# Patient Record
Sex: Male | Born: 2000
Health system: Southern US, Community
[De-identification: ages and names within clinical notes are randomized; demographics above are authoritative.]

## PROBLEM LIST (undated history)

## (undated) DIAGNOSIS — F902 Attention-deficit hyperactivity disorder, combined type: Principal | ICD-10-CM

## (undated) DIAGNOSIS — G935 Compression of brain: Secondary | ICD-10-CM

## (undated) DIAGNOSIS — F909 Attention-deficit hyperactivity disorder, unspecified type: Secondary | ICD-10-CM

## (undated) DIAGNOSIS — R278 Other lack of coordination: Secondary | ICD-10-CM

## (undated) DIAGNOSIS — L80 Vitiligo: Secondary | ICD-10-CM

## (undated) HISTORY — DX: Attention-deficit hyperactivity disorder, combined type: F90.2

## (undated) HISTORY — DX: Vitiligo: L80

## (undated) HISTORY — DX: Attention-deficit hyperactivity disorder, unspecified type: F90.9

## (undated) HISTORY — PX: CRANIOTOMY: SHX93

## (undated) HISTORY — PX: OTHER SURGICAL HISTORY: SHX169

## (undated) HISTORY — DX: Other lack of coordination: R27.8

## (undated) HISTORY — PX: HYPOSPADIAS CORRECTION: SHX483

---
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
## 2021-02-16 ENCOUNTER — Other Ambulatory Visit: Payer: Self-pay

## 2021-02-16 MED ORDER — DEXMETHYLPHENIDATE HCL ER 30 MG PO CP24: 30.0000 mg | ORAL_CAPSULE | Freq: Every morning | ORAL | 0 refills | Status: DC

## 2021-02-16 NOTE — Telephone Encounter (Signed)
Next appt not scheduled: Visit date not found Note to pharmacy. No further refills without appointment  E-Prescribed Focalin XR 30 directly to  [id number] [place] [place] - [address] [place] [address] Phone: [phone] Fax: [phone]

## 2021-05-15 ENCOUNTER — Other Ambulatory Visit: Payer: Self-pay

## 2021-05-15 MED ORDER — DEXMETHYLPHENIDATE HCL ER 30 MG PO CP24: 30.0000 mg | ORAL_CAPSULE | Freq: Every morning | ORAL | 0 refills | Status: DC

## 2021-05-15 NOTE — Telephone Encounter (Signed)
RX for above e-scribed and sent to pharmacy on record ° °[facility] [place], [place] - [address] °[address] °[place] [address] °Phone: [phone] Fax: [phone] ° ° °

## 2021-05-29 ENCOUNTER — Encounter: Payer: Medicaid Other | Admitting: Pediatrics

## 2021-06-23 ENCOUNTER — Other Ambulatory Visit: Payer: Self-pay

## 2021-06-23 ENCOUNTER — Telehealth (INDEPENDENT_AMBULATORY_CARE_PROVIDER_SITE_OTHER): Payer: Medicaid Other | Admitting: Pediatrics

## 2021-06-23 ENCOUNTER — Encounter: Payer: Self-pay | Admitting: Pediatrics

## 2021-06-23 DIAGNOSIS — F902 Attention-deficit hyperactivity disorder, combined type: Secondary | ICD-10-CM | POA: Diagnosis not present

## 2021-06-23 DIAGNOSIS — Z719 Counseling, unspecified: Secondary | ICD-10-CM

## 2021-06-23 DIAGNOSIS — Z79899 Other long term (current) drug therapy: Secondary | ICD-10-CM | POA: Diagnosis not present

## 2021-06-23 DIAGNOSIS — R278 Other lack of coordination: Secondary | ICD-10-CM | POA: Diagnosis not present

## 2021-06-23 DIAGNOSIS — H9325 Central auditory processing disorder: Secondary | ICD-10-CM

## 2021-06-23 NOTE — Patient Instructions (Signed)
DISCUSSION: Counseled regarding the following coordination of care items:  Continue medication as directed Focalin XR 30 mg every morning No refill today recently submitted  We discussed short acting dexmethylphenidate 10 to 20 mg for as needed usage for evening studying Ryan Mcdaniel will reach out to me if he would like a trial  Advised importance of:  Sleep Maintain good sleep routines avoiding late nights Limited screen time (none on school nights, no more than 2 hours on weekends) Always reduce screen time Regular exercise(outside and active play) Continue excellent physical activities and social engagements Healthy eating (drink water, no sodas/sweet tea) Protein rich avoiding junk food and empty calories  Discussed tutoring needs with DSO.

## 2021-06-23 NOTE — Progress Notes (Signed)
[facility] Ryan Mcdaniel [address], [place]. [address] [place] [address] Dept: [phone] Dept Fax: [phone]  Medication Check by Caregility due to COVID-19  Patient ID:  Ryan Mcdaniel  male DOB: [birth year]   20 y.o.   MRN: [id number]   DATE:06/23/21  PCP: Eliberto Ivory, MD  Interviewed: Malcolm Metro  Location: at home in [place], on fall break Provider location: [facility] [place] [facility]   Virtual Visit via Video Note Connected with Malcolm Metro on 06/23/21 at  2:00 PM EDT by video enabled telemedicine application and verified that I am speaking with the correct person using two identifiers.     I discussed the limitations, risks, security and privacy concerns of performing an evaluation and management service by telephone and the availability of in person appointments. I also discussed with the parent/patient that there may be a patient responsible charge related to this service. The parent/patient expressed understanding and agreed to proceed.  HISTORY OF PRESENT ILLNESS/CURRENT STATUS: [name] is being followed for medication management for ADHD, dysgraphia and learning differences.   Last visit on 12/01/20  Nathanyl currently prescribed Focalin XR 30 mg every morning  Behaviors: doing well  Eating well (eating breakfast, lunch and dinner). Not with meal plan, will make meals. (Chicken, fish, sandwiches).  Elimination: no concerns  Sleeping: no concerns Sleeping through the night.   EDUCATION: School: [employer]  Sophomore LBST x2 (critical thinking), writing and research), Intro to exercise, foundations, Anatomy & Physiology with lab Wants to coach - doing exercise science. Doing well in all, but Anatomy is difficult did fail the first test.  Not dialed into tutoring yet. 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 :No  Family Medical/ Social History: Changes? No   Patient Lives with: three roommates, off campus apartment, has separate rooms and bathroom Good fit, no drama  MENTAL HEALTH: No concerns  ASSESSMENT:  [name] is 77-years of age with a diagnosis of ADHD/dysgraphia with central auditory processing disorder that is well controlled and adequately medicated.  We discussed tutoring needs for difficult classes and he will access DSO for resources. We discussed study time and maintaining good sleep hygiene and decreasing screen time to optimize performance.  We discussed short acting medication to extend length of day and coverage will reach out to me if you would like to trial Focalin short acting 10 to 20 mg as needed. He is without a meal plan this year we discussed the need for protein rich diets as well as making sure he is eating adequately to cover his brain energy needs as well as maintain weight and cover physical activity.  He is very social and engaging in experiences as well as playing soccer frequently. No changes to current morning medication and the ADHD stable with medication management. We will follow back in 6 months.  DIAGNOSES:    ICD-10-CM   1. ADHD (attention deficit hyperactivity disorder), combined type  F90.2     2. Central auditory processing disorder  H93.25     3. Dysgraphia  R27.8     4. Medication management  Z79.899     5. Patient counseled  Z71.9  RECOMMENDATIONS:  Patient Instructions  DISCUSSION: Counseled regarding the following coordination of care items:  Continue medication as directed Focalin XR 30 mg every morning No refill today recently submitted  We discussed  short acting dexmethylphenidate 10 to 20 mg for as needed usage for evening studying Tahjir will reach out to me if he would like a trial  Advised importance of:  Sleep Maintain good sleep routines avoiding late nights Limited screen time (none on school nights, no more than 2 hours on weekends) Always reduce screen time Regular exercise(outside and active play) Continue excellent physical activities and social engagements Healthy eating (drink water, no sodas/sweet tea) Protein rich avoiding junk food and empty calories  Discussed tutoring needs with DSO.     NEXT APPOINTMENT:  Return in about 7 months (around 01/21/2022) for Medication Check. Please call the office for a sooner appointment if problems arise.  Medical Decision-making:  I spent 20 minutes dedicated to the care of this patient on the date of this encounter to include face to face time with the patient and/or parent reviewing medical records and documentation by teachers, performing and discussing the assessment and treatment plan, reviewing and explaining completed speciality labs and obtaining specialty lab samples.  The patient and/or parent was provided an opportunity to ask questions and all were answered. The patient and/or parent agreed with the plan and demonstrated an understanding of the instructions.   The patient and/or parent was advised to call back or seek an in-person evaluation if the symptoms worsen or if the condition fails to improve as anticipated.  I provided 20 minutes of non-face-to-face time during this encounter.   Completed record review for 5 minutes prior to and after the virtual visit.   Disclaimer: This documentation was generated through the use of dictation and/or voice recognition software, and as such, may contain spelling or other transcription errors. Please disregard any inconsequential errors.  Any questions regarding the content of this documentation should be directed to the  individual who electronically signed.

## 2021-08-18 ENCOUNTER — Other Ambulatory Visit: Payer: Self-pay

## 2021-08-19 MED ORDER — DEXMETHYLPHENIDATE HCL ER 30 MG PO CP24: 30.0000 mg | ORAL_CAPSULE | Freq: Every morning | ORAL | 0 refills | Status: DC

## 2021-08-19 NOTE — Telephone Encounter (Signed)
RX for above e-scribed and sent to pharmacy on record  [id number] [place], [place] - [address] [place] [address] Phone: [phone] Fax: [phone]   

## 2021-08-24 ENCOUNTER — Other Ambulatory Visit: Payer: Self-pay

## 2021-08-24 MED ORDER — DEXMETHYLPHENIDATE HCL ER 30 MG PO CP24
30.0000 mg | ORAL_CAPSULE | Freq: Every morning | ORAL | 0 refills | Status: DC
Start: 2021-08-24 — End: 2021-09-21

## 2021-08-24 NOTE — Telephone Encounter (Signed)
Focalin XR 30 mg daily, # 30 with no RF's.RX for above e-scribed and sent to pharmacy on record  [id number] [place] [place] - [address] [place] [address] Phone: [phone] Fax: [phone]

## 2021-09-21 ENCOUNTER — Telehealth: Payer: Self-pay

## 2021-09-21 ENCOUNTER — Other Ambulatory Visit: Payer: Self-pay

## 2021-09-21 MED ORDER — DEXMETHYLPHENIDATE HCL ER 30 MG PO CP24
30.0000 mg | ORAL_CAPSULE | Freq: Every morning | ORAL | 0 refills | Status: DC
Start: 2021-09-21 — End: 2021-11-19

## 2021-09-21 NOTE — Telephone Encounter (Signed)
RX for above e-scribed and sent to pharmacy on record  [id number] [place], [place] - [address] [place] [address] Phone: [phone] Fax: [phone]   

## 2021-09-22 NOTE — Telephone Encounter (Signed)
Outcome N/Aon January 9 Your PA request has been closed. DEXMETHYLPHENIDATE HYDROCHLORIDE ER 30MG  ER CAP #90/90 days // Prior Auth Req-MD call [phone]. MAX QTY OF 90.000 IN 075 DAYS QUANTITY REMAINING 60.000 NEXT FILL DT [phone], LAST FILL DT [phone] [facility]# [id number] [facility] - Plan Limitations Exceeded 76 // F90.2-Attention-deficit hyperactivity disorder, combined type // Request has been sent to [facility] for review - AG, 09/21/2021 02:04 PM

## 2021-11-19 ENCOUNTER — Other Ambulatory Visit: Payer: Self-pay

## 2021-11-19 MED ORDER — DEXMETHYLPHENIDATE HCL ER 30 MG PO CP24: 30.0000 mg | ORAL_CAPSULE | Freq: Every morning | ORAL | 0 refills | Status: DC

## 2021-11-19 NOTE — Telephone Encounter (Signed)
RX for above e-scribed and sent to pharmacy on record  [id number] [place], [place] - [address] [place] [address] Phone: [phone] Fax: [phone]   

## 2021-12-23 ENCOUNTER — Other Ambulatory Visit: Payer: Self-pay | Admitting: Pediatrics

## 2021-12-23 MED ORDER — DEXMETHYLPHENIDATE HCL ER 30 MG PO CP24: 30.0000 mg | ORAL_CAPSULE | Freq: Every morning | ORAL | 0 refills | Status: DC

## 2021-12-23 NOTE — Telephone Encounter (Signed)
RX for above e-scribed and sent to pharmacy on record  [id number] [place], [place] - [address] [place] [address] Phone: [phone] Fax: [phone]   

## 2022-02-16 ENCOUNTER — Ambulatory Visit (INDEPENDENT_AMBULATORY_CARE_PROVIDER_SITE_OTHER): Payer: Medicaid Other | Admitting: Pediatrics

## 2022-02-16 ENCOUNTER — Encounter: Payer: Self-pay | Admitting: Pediatrics

## 2022-02-16 VITALS — Ht 69.5 in | Wt 170.0 lb

## 2022-02-16 DIAGNOSIS — H9325 Central auditory processing disorder: Secondary | ICD-10-CM

## 2022-02-16 DIAGNOSIS — F902 Attention-deficit hyperactivity disorder, combined type: Secondary | ICD-10-CM | POA: Diagnosis not present

## 2022-02-16 DIAGNOSIS — Z719 Counseling, unspecified: Secondary | ICD-10-CM

## 2022-02-16 DIAGNOSIS — Z79899 Other long term (current) drug therapy: Secondary | ICD-10-CM | POA: Diagnosis not present

## 2022-02-16 MED ORDER — DEXMETHYLPHENIDATE HCL ER 30 MG PO CP24: 30.0000 mg | ORAL_CAPSULE | Freq: Every morning | ORAL | 0 refills | Status: DC

## 2022-02-16 NOTE — Patient Instructions (Signed)
DISCUSSION: Counseled regarding the following coordination of care items:  Continue medication as directed   Focalin XR 30 mg every morning. RX for above e-scribed and sent to pharmacy on record  [phone] [place], [place] - [address] [place] [address] Phone: [phone] Fax: [phone]   Advised importance of:  Sleep Maintain good sleep routines, avoid late nights  Limited screen time (none on school nights, no more than 2 hours on weekends) Maintain good screen time reduction, avoid endless hours of gaming  Regular exercise(outside and active play) Continue daily physical activities with skill building  Healthy eating (drink water, no sodas/sweet tea) Protein rich avoiding junk and empty calories

## 2022-02-16 NOTE — Progress Notes (Signed)
Medication Check  Patient ID: Ryan Mcdaniel  DOB: [birth year]  MRN: [id number]  DATE:02/16/22 Ryan Ivory, MD  Accompanied by: Self Patient Lives with: parents Has student apartment in [place] (has three apartment mates) Not close, just with one - very different from [place] experience  HISTORY/CURRENT STATUS: Chief Complaint - Polite and cooperative and present for medical follow up for medication management of ADHD.  Last in person follow-up 12/01/2020 and last video follow-up 06/23/2021.  Currently prescribed Focalin XR 30 mg every morning, reports daily medication with good compliance. PMP aware reviewed on this date also indicates good compliance.  Requesting no medication changes.   EDUCATION: School: [employer] Year/Grade: rising junior Changed majors from sports management - did not like the classes To criminal justice - wants to be a Charity fundraiser multiple career tests to determine interests. 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  No Sleep concerns. Counseled to maintain good sleep routines avoiding late nights Elimination: no  concerns  Individual Medical History/ Review of Systems: Changes? :No Has yearly check ups, was sick from job in February - they had a kid vomit in the pool, he got hand food mouth disease with blisters Counseled the need for daily physical exams and preventative maintenance  Family Medical/ Social History: Changes?  No concerns  MENTAL HEALTH: Denies sadness, loneliness or depression.  Denies self harm or thoughts of self harm or injury. Denies fears, worries and anxieties. Has good peer relations and is not a bully nor is victimized.     02/16/2022    9:18 AM  Depression screen PHQ 2/9  Decreased Interest 0  Down, Depressed, Hopeless 0  PHQ - 2 Score 0  Altered sleeping 0  Tired, decreased energy 0  Change in appetite 0  Feeling bad or failure about yourself  0  Trouble concentrating 1  Moving slowly or fidgety/restless 1  Suicidal thoughts 0  PHQ-9 Score 2  Difficult doing work/chores Not difficult at all       02/16/2022    9:17 AM  GAD 7 : Generalized Anxiety Score  Nervous, Anxious, on Edge 1  Control/stop worrying 1  Worry too much - different things 0  Trouble relaxing 0  Restless 1  Easily annoyed or irritable 1  Afraid - awful might happen 0  Total GAD 7 Score 4  Anxiety Difficulty Not difficult at all     Adult ADHD Self Report Scale (most recent)     Adult ADHD Self-Report Scale (ASRS-v1.1) Symptom Checklist - 02/16/22 0919       Part A   1. How often do you have trouble wrapping up the final details of a project, once the challenging parts have been done? Never  2. How often do you have  difficulty getting things done in order when you have to do a task that requires organization? Rarely    3. How often do you have problems remembering appointments or obligations? Never  4. When you have a task that requires a lot of thought, how often do you avoid or delay getting started? Rarely    5. How often do you fidget or squirm with your hands or feet when you  have to sit down for a long time? Rarely  6. How often do you feel overly active and compelled to do things, like you were driven by a motor? Rarely      Part B   7. How often do you make careless mistakes when you have to work on a boring or difficult project? Never  8. How often do you have difficulty keeping your attention when you are doing boring or repetitive work? Rarely    9. How often do you have difficulty concentrating on what people say to you, even when they are speaking to you directly? Rarely  10. How often do you misplace or have difficulty finding things at home or at work? Never    11. How often are you distracted by activity or noise around you? Rarely  12. How often do you leave your seat in meetings or other situations in which you are expected to remain seated? Never    13. How often do you feel restless or fidgety? Rarely  14. How often do you have difficulty unwinding and relaxing when you have time to yourself? Never    15. How often do you find yourself talking too much when you are in social situations? Never  16. When you are in a conversation, how often do you find yourself finishing the sentences of the people you are talking to, before they can finish them themselves? Rarely    17. How often do you have difficulty waiting your turn in situations when turn taking is required? Never  18. How often do you interrupt others when they are busy? Never      Comment   How old were you when these problems first began to occur? 0                PHYSICAL EXAM; Vitals:   02/16/22 0904  Weight: 170 lb (77.1 kg)  Height: 5' 9.5" (1.765 m)   Body mass index is 24.74 kg/m.  General Physical Exam: Unchanged from previous exam, date:12/01/20   ASSESSMENT:  Ryan Mcdaniel is 59-years of age with a diagnosis of ADHD with a history of CAPD that is improved and well controlled with current medication.  No medication changes at this time. We discussed the continued need for daily  screen time reduction, daily physical activities and more physicality.  Protein rich food to avoid junk and empty calories.  Continue excellent social skills and skill groups.  Maintain good sleep routines avoiding late nights. ADHD stable with medication management Has Appropriate school accommodations with progress academically I spent 35 minutes face to face on the date of service and engaged in the above activities to include counseling and education.  DIAGNOSES:    ICD-10-CM   1. ADHD (attention deficit hyperactivity disorder), combined type  F90.2     2. Central auditory processing disorder  H93.25     3. Medication management  Z79.899     4. Patient counseled  Z71.9       RECOMMENDATIONS:  Patient Instructions  DISCUSSION: Counseled regarding the following  coordination of care items:  Continue medication as directed   Focalin XR 30 mg every morning. RX for above e-scribed and sent to pharmacy on record  [id number] [place] [place] [facility] [id number] [place] [address] Phone: [phone] Fax: [phone]   Advised importance of:  Sleep Maintain good sleep routines, avoid late nights  Limited screen time (none on school nights, no more than 2 hours on weekends) Maintain good screen time reduction, avoid endless hours of gaming  Regular exercise(outside and active play) Continue daily physical activities with skill building  Healthy eating (drink water, no sodas/sweet tea) Protein rich avoiding junk and empty calories      NEXT APPOINTMENT:  Return in about 6 months (around 08/18/2022) for Medication Check.  Disclaimer: This documentation was generated through the use of dictation and/or voice recognition software, and as such, may contain spelling or other transcription errors. Please disregard any inconsequential errors.  Any questions regarding the content of this documentation should be directed to the individual  who electronically signed.

## 2022-03-02 ENCOUNTER — Other Ambulatory Visit: Payer: Self-pay | Admitting: Pediatrics

## 2022-03-02 MED ORDER — DEXMETHYLPHENIDATE HCL ER 30 MG PO CP24
30.0000 mg | ORAL_CAPSULE | Freq: Every morning | ORAL | 0 refills | Status: DC
Start: 2022-03-02 — End: 2022-03-29

## 2022-03-02 NOTE — Telephone Encounter (Signed)
Spoke with pharmacy.  Issue may be #90.  Will fill for #30 and will need monthly prescriptions for 30 day supplies. Giovanni notified by email.

## 2022-03-02 NOTE — Telephone Encounter (Signed)
RX for above e-scribed and sent to pharmacy on record  [id number] [place], [place] - [address] [place] [address] Phone: [phone] Fax: [phone]   

## 2022-03-02 NOTE — Addendum Note (Signed)
Addended by: Shelby Peltz A on: 03/02/2022 12:12 PM   Modules accepted: Orders

## 2022-03-02 NOTE — Telephone Encounter (Signed)
PDMP aware reconciliation completed needed to open visit to do so

## 2022-03-29 ENCOUNTER — Other Ambulatory Visit: Payer: Self-pay

## 2022-03-29 MED ORDER — DEXMETHYLPHENIDATE HCL ER 30 MG PO CP24: 30.0000 mg | ORAL_CAPSULE | Freq: Every morning | ORAL | 0 refills | Status: DC

## 2022-03-29 NOTE — Telephone Encounter (Signed)
E-Prescribed dexmethylphenidate ER 30 mg directly to  [id number] [place], [place] - [address] [facility] [address] [place] [address] Phone: [phone] Fax: [phone]

## 2022-03-30 ENCOUNTER — Telehealth: Payer: Self-pay

## 2022-03-30 NOTE — Telephone Encounter (Signed)
Outcome  Additional Information Required  Your PA has been resolved, no additional PA is required. For further inquiries please contact the number on the back of the member prescription card. (Message [phone])

## 2022-05-28 ENCOUNTER — Other Ambulatory Visit: Payer: Self-pay

## 2022-05-28 MED ORDER — DEXMETHYLPHENIDATE HCL ER 30 MG PO CP24: 30.0000 mg | ORAL_CAPSULE | Freq: Every morning | ORAL | 0 refills | Status: DC

## 2022-05-28 NOTE — Telephone Encounter (Signed)
Fccalin XR 30 mg daily, # 30 with no RF's.RX for above e-scribed and sent to pharmacy on record  [phone] [place], [place] - [address] [address] [place] [address] Phone: [phone] Fax: [phone]

## 2022-07-01 ENCOUNTER — Other Ambulatory Visit: Payer: Self-pay

## 2022-07-01 MED ORDER — DEXMETHYLPHENIDATE HCL ER 30 MG PO CP24: 30.0000 mg | ORAL_CAPSULE | Freq: Every morning | ORAL | 0 refills | Status: DC

## 2022-07-01 NOTE — Telephone Encounter (Signed)
RX for above e-scribed and sent to pharmacy on record  [id number] [place], [place] - [address] [place] [address] Phone: [phone] Fax: [phone]

## 2022-07-30 ENCOUNTER — Other Ambulatory Visit: Payer: Self-pay

## 2022-07-30 MED ORDER — DEXMETHYLPHENIDATE HCL ER 30 MG PO CP24
30.0000 mg | ORAL_CAPSULE | Freq: Every morning | ORAL | 0 refills | Status: DC
Start: 2022-07-30 — End: 2022-08-31

## 2022-07-30 NOTE — Telephone Encounter (Signed)
RX for above e-scribed and sent to pharmacy on record  [id number] [place], [place] - [address] [place]  [address] Phone: [phone] Fax: [phone]   

## 2022-08-31 ENCOUNTER — Other Ambulatory Visit: Payer: Self-pay

## 2022-08-31 MED ORDER — DEXMETHYLPHENIDATE HCL ER 30 MG PO CP24: 30.0000 mg | ORAL_CAPSULE | Freq: Every morning | ORAL | 0 refills | Status: DC

## 2022-08-31 NOTE — Telephone Encounter (Signed)
RX for above e-scribed and sent to pharmacy on record  [phone] [place], [place] - [address] [facility] [address] [facility] [address] [place] [address] Phone: [phone] Fax: [phone]

## 2022-09-08 ENCOUNTER — Telehealth: Payer: Medicaid Other | Admitting: Pediatrics

## 2022-09-28 ENCOUNTER — Other Ambulatory Visit: Payer: Self-pay

## 2022-09-28 MED ORDER — DEXMETHYLPHENIDATE HCL ER 30 MG PO CP24: 30.0000 mg | ORAL_CAPSULE | Freq: Every morning | ORAL | 0 refills | Status: DC

## 2022-09-28 NOTE — Telephone Encounter (Signed)
Focalin XR 30 mg daily, #30 with no RF's.RX for above e-scribed and sent to pharmacy on record  [id number] [place] [address] [place]: [phone]: [phone]

## 2022-10-08 ENCOUNTER — Telehealth (INDEPENDENT_AMBULATORY_CARE_PROVIDER_SITE_OTHER): Payer: 59 | Admitting: Pediatrics

## 2022-10-08 ENCOUNTER — Encounter: Payer: Self-pay | Admitting: Pediatrics

## 2022-10-08 DIAGNOSIS — F902 Attention-deficit hyperactivity disorder, combined type: Secondary | ICD-10-CM | POA: Diagnosis not present

## 2022-10-08 DIAGNOSIS — Z79899 Other long term (current) drug therapy: Secondary | ICD-10-CM | POA: Diagnosis not present

## 2022-10-08 DIAGNOSIS — Z719 Counseling, unspecified: Secondary | ICD-10-CM

## 2022-10-08 NOTE — Patient Instructions (Signed)
DISCUSSION: Counseled regarding the following coordination of care items:  Transition of care back to PCP-patient is aware  Continue medication as directed Focalin XR 30 mg every morning  RX for above e-scribed and sent to pharmacy on record  [id number] [place], [place] - [address] [place] [address] Phone: [phone] Fax: [phone]  Advised importance of:  Sleep Maintain good sleep routines and avoid late nights Limited screen time (none on school nights, no more than 2 hours on weekends) Continue screen time reduction Regular exercise(outside and active play) Continue daily physical activities with skill building Healthy eating (drink water, no sodas/sweet tea) Routine rich avoiding junk and empty calories   Additional resources for parents:  [place] - [email or web address].org/ ADD[employer] HolyTattoo.de

## 2022-10-08 NOTE — Progress Notes (Signed)
[facility] [address] [place] [address] Dept: [phone] Dept Fax: [phone]  Medication Check by Caregility due to COVID-19  Patient ID:  Ryan Mcdaniel  male DOB: 2000/09/19   22 y.o.   MRN: [id number]   DATE:10/08/22  Interviewed: Coletta Memos  Location: [place] apartment no additional individuals present Provider location: [facility] office  Virtual Visit via Video Note Connected with Coletta Memos on 10/08/22 at  3:00 PM EST by video enabled telemedicine application and verified that I am speaking with the correct person using two identifiers.     I discussed the limitations, risks, security and privacy concerns of performing an evaluation and management service by telephone and the availability of in person appointments. I also discussed with the parent/patient that there may be a patient responsible charge related to this service. The parent/patient expressed understanding and agreed to proceed.  HISTORY OF PRESENT ILLNESS/CURRENT STATUS: Ryan Mcdaniel is being followed for medication management for ADHD, CAPD and learning differences.  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 Wants to be a Tax adviser  Employed - new job - [employer] - get's paid more, less stressful, better pay and likes it better  Activities/ Exercise: daily Soccer Insurance claims handler Counseled maintain daily activities with social engagements  Screen time: (phone, tablet, TV, computer): non-essential, not excessive Counseled screen time reduction and avoiding social media MEDICAL HISTORY: Individual Medical History/ Review of Systems: Changes? :No  Family Medical/ Social History: Changes? Yes counseling provided today regarding parental marital discord discussed coping skills and strategies to minimize emotional effects.  The majority of this visit included counseling regarding parental discord as well as his personal relationship challenges. Patient Lives with: at school, has three roommates (one is a best friend), two random. No concerns for roommates.  ASSESSMENT:  Ryan Mcdaniel is 30-years of age with a diagnosis of ADHD that is improved and well-controlled with current medication.  No medication changes at this time.  We discussed the transition of care back to primary care and notes will be sent to the PCP on record.  I do recommend that Ryan Mcdaniel establish with family practice in his area in [place] for ease of medication management.  Additional resources for medication management were also provided. I do recommend if there are glitches to contact our office phone number and request refills from the pediatric subspecialty group that we are transferring to. Overall Ryan Mcdaniel ADHD stable with medication management  DIAGNOSES:    ICD-10-CM   1. ADHD (attention deficit hyperactivity disorder), combined type  F90.2     2. Medication management  Z79.899     3. Patient counseled  Z71.9        RECOMMENDATIONS:  Patient Instructions  DISCUSSION: Counseled regarding the following coordination of care items:  Transition of care back to PCP-patient is aware  Continue medication as directed Focalin XR 30 mg every morning  RX for above e-scribed and sent to pharmacy on  record  [id number] [place],  - [address] [place] [address] Phone: [phone] Fax: [phone]  Advised importance of:  Sleep Maintain good sleep routines and avoid late nights Limited screen time (none on school nights, no more than 2 hours on weekends) Continue screen time reduction Regular exercise(outside and active play) Continue daily physical activities with skill building Healthy eating (drink water, no sodas/sweet tea) Routine rich avoiding junk and empty calories   Additional resources for parents:  [place] - https://childmind.org/ ADDitude Magazine HolyTattoo.de        NEXT APPOINTMENT:  Return for Transition of care back to PCP. Please call the office for a sooner appointment if problems arise.  Medical Decision-making:  I spent 40 minutes dedicated to the care of this patient on the date of this encounter to include face to face time with the patient and/or parent reviewing medical records and documentation by teachers, performing and discussing the assessment and treatment plan, reviewing and explaining completed speciality labs and obtaining specialty lab samples.  The patient and/or parent was provided an opportunity to ask questions and all were answered. The patient and/or parent agreed with the plan and demonstrated an understanding of the instructions.   The patient and/or parent was advised to call back or seek an in-person evaluation if the symptoms worsen or if the condition fails to improve as anticipated.  I provided 40 minutes of video-face-to-face time during this encounter.   Completed record review for 15 minutes prior to and after the virtual visit.   Disclaimer: This documentation was generated through the use of dictation and/or voice recognition software, and as such, may contain spelling or other transcription errors. Please disregard any inconsequential errors.  Any questions regarding the content of this documentation should be  directed to the individual who electronically signed.

## 2022-10-15 ENCOUNTER — Telehealth: Payer: Medicaid Other | Admitting: Pediatrics

## 2022-10-29 ENCOUNTER — Telehealth: Payer: Self-pay | Admitting: Family

## 2022-10-29 DIAGNOSIS — F902 Attention-deficit hyperactivity disorder, combined type: Secondary | ICD-10-CM

## 2022-10-29 MED ORDER — DEXMETHYLPHENIDATE HCL ER 30 MG PO CP24: 30.0000 mg | ORAL_CAPSULE | Freq: Every morning | ORAL | 0 refills | Status: DC

## 2022-10-29 NOTE — Telephone Encounter (Signed)
Focalin XR 30 mg daily, #30 with no RF's.RX for above e-scribed and sent to pharmacy on record  [id number] [place] [address] [place]: [phone]: [phone]

## 2022-10-29 NOTE — Telephone Encounter (Signed)
Last OV 10/08/2022 by Janifer Adie NP Advised at that visit to establish with [facility] in [place] where he lives.  Focalin 30 mg XR last filled 10/08/22 with no refills

## 2022-10-29 NOTE — Telephone Encounter (Signed)
  Name of who is calling: Xz[place]on (self)  Best contact number: [phone]  Provider they see: [place]  Reason for call: Patient is calling needing a refill on his medication (Focalin) 30 mg.      PRESCRIPTION REFILL ONLY  Name of prescription: Focalin (Dexmethylphenidate)   [facility] [address]

## 2022-12-03 ENCOUNTER — Other Ambulatory Visit: Payer: Self-pay | Admitting: Pediatrics

## 2022-12-03 DIAGNOSIS — F902 Attention-deficit hyperactivity disorder, combined type: Secondary | ICD-10-CM

## 2022-12-03 NOTE — Telephone Encounter (Signed)
  Name of who is calling: Ryan Mcdaniel Relationship to Patient:  Best contact number: [phone]  Provider they see: Hoy Register  Reason for call: He is calling to get a refill on his medication. Previously he saw Janifer Adie and she told him to call us and speak with Dr. Rogers Blocker to get his meds refilled. Please follow up      [place]  Name of prescription:  Pharmacy:

## 2022-12-03 NOTE — Telephone Encounter (Signed)
Contacted patient.  Verified his DOB.  Informed him that he would need to contact his PCP for medication refills.   Ryan Mcdaniel stated that he does not have a PCP nor would they be able to help him because this is a psychiatry medication.   (Dexmethylphenidate HCl 30 MG)   According to patient, Ryan Mcdaniel told him to ask Dr. Rogers Blocker for refills and to manage his medications.   I informed patient that I would send this request to Dr. Rogers Blocker but there is a chance that she mat deny this request because he has not established care with her. In the meantime, I advised him to establish care with a PCP ASAP to help with medication management.   SS, CCMA

## 2022-12-06 MED ORDER — DEXMETHYLPHENIDATE HCL ER 30 MG PO CP24: 30.0000 mg | ORAL_CAPSULE | Freq: Every morning | ORAL | 0 refills | Status: AC

## 2022-12-06 NOTE — Telephone Encounter (Signed)
Patient was advised in January and February that he needs to establish PCP.    Because he was seen within the last 3 months, I will send 1 month supply.  However he MUST establish with PCP or another provider for further refills.   Carylon Perches MD MPH

## 2023-04-08 ENCOUNTER — Institutional Professional Consult (permissible substitution): Payer: Medicaid Other | Admitting: Pediatrics

## 2024-04-16 ENCOUNTER — Ambulatory Visit
Admission: EM | Admit: 2024-04-16 | Discharge: 2024-04-16 | Disposition: A | Discharge status: Home / Self Care | Attending: Family Medicine | Admitting: Family Medicine

## 2024-04-16 ENCOUNTER — Ambulatory Visit

## 2024-04-16 ENCOUNTER — Encounter: Payer: Self-pay | Admitting: Emergency Medicine

## 2024-04-16 DIAGNOSIS — M25571 Pain in right ankle and joints of right foot: Secondary | ICD-10-CM

## 2024-04-16 DIAGNOSIS — S99911A Unspecified injury of right ankle, initial encounter: Secondary | ICD-10-CM | POA: Diagnosis not present

## 2024-04-16 NOTE — ED Provider Notes (Signed)
 [name]    CSN: [id number] Arrival date & time: 04/16/24  1853      History   Chief Complaint Chief Complaint  Patient presents with   Ankle Pain    HPI Luisantonio Adinolfi is a 23 y.o. male.   HPI 23 year old male presents with right ankle pain secondary to right ankle injury injury running today.SABRA  PMH significant for ADHD, Chiari I malformation, and dysgraphia  Past Medical History:  Diagnosis Date   ADHD (attention deficit hyperactivity disorder)    ADHD (attention deficit hyperactivity disorder), combined type 12/30/2015   Chiari I malformation (HCC)    Dysgraphia 12/30/2015   Primary vitiligo 12/30/2015   Vitiligo     Patient Active Problem List   Diagnosis Date Noted   Central auditory processing disorder 04/11/2020   ADHD (attention deficit hyperactivity disorder), combined type 12/30/2015   Dysgraphia 12/30/2015   Primary vitiligo 12/30/2015    Past Surgical History:  Procedure Laterality Date   chiari malformaiton     age 89, preventative   CRANIOTOMY     HYPOSPADIAS CORRECTION     age 60 months       Home Medications    Prior to Admission medications   Medication Sig Start Date End Date Taking? Authorizing Provider  Dexmethylphenidate  HCl 30 MG CP24 Take 1 capsule (30 mg total) by mouth every morning. 12/06/22  Yes Waddell Corean HERO, MD  Adapalene-Benzoyl Peroxide 0.1-2.5 % gel APPLY TO AFFECTED AREA EVERY DAY 08/02/19   [provider]  clobetasol cream (TEMOVATE) 0.05 % Apply topically. 06/22/13   [provider]  doxycycline (VIBRA-TABS) 100 MG tablet Take 100 mg by mouth daily. Patient not taking: Reported on 02/16/2022 08/02/19   [provider]  tacrolimus (PROTOPIC) 0.1 % ointment Apply topically. 06/22/13   [provider]  triamcinolone cream (KENALOG) 0.1 % SMARTSIG:1 Application Topical 2-3 Times Daily 10/27/21   [provider]  UREA 20 INTENSIVE HYDRATING 20 % cream APPLY TO ENTIRE  FACE NIGHTLY 04/28/16   [provider]    Family History Family History  Adopted: Yes  Problem Relation Age of Onset   Sudden death Neg Hx    Hypertension Neg Hx    Heart attack Neg Hx    Hyperlipidemia Neg Hx    Diabetes Neg Hx     Social History Social History   Tobacco Use   Smoking status: Never    Passive exposure: Never   Smokeless tobacco: Never  Vaping Use   Vaping status: Never Used  Substance Use Topics   Alcohol use: Yes    Alcohol/week: 2.0 standard drinks of alcohol    Types: 2 Glasses of wine per week   Drug use: No     Allergies   Patient has no known allergies.   Review of Systems Review of Systems  Musculoskeletal:        Left ankle injury/left ankle pain     Physical Exam Triage Vital Signs ED Triage Vitals  Encounter Vitals Group     BP      Girls Systolic BP Percentile      Girls Diastolic BP Percentile      Boys Systolic BP Percentile      Boys Diastolic BP Percentile      Pulse      Resp      Temp      Temp src      SpO2      Weight  Height      Head Circumference      Peak Flow      Pain Score      Pain Loc      Pain Education      Exclude from Growth Chart    No data found.  Updated Vital Signs BP 132/81 (BP Location: Right Arm)   Pulse 62   Temp 99.9 F (37.7 C) (Oral)   Resp 18   Ht 5' 9.5 (1.765 m)   Wt 205 lb (93 kg)   SpO2 98%   BMI 29.84 kg/m   Visual Acuity Right Eye Distance:   Left Eye Distance:   Bilateral Distance:    Right Eye Near:   Left Eye Near:    Bilateral Near:     Physical Exam Vitals and nursing note reviewed.  Constitutional:      Appearance: Normal appearance. He is normal weight.  HENT:     Head: Normocephalic and atraumatic.     Mouth/Throat:     Mouth: Mucous membranes are moist.     Pharynx: Oropharynx is clear.  Eyes:     Extraocular Movements: Extraocular movements intact.     Conjunctiva/sclera: Conjunctivae normal.     Pupils: Pupils are equal,  round, and reactive to light.  Cardiovascular:     Rate and Rhythm: Normal rate and regular rhythm.     Pulses: Normal pulses.     Heart sounds: Normal heart sounds.  Pulmonary:     Effort: Pulmonary effort is normal.     Breath sounds: Normal breath sounds. No wheezing, rhonchi or rales.  Musculoskeletal:        General: Normal range of motion.  Skin:    General: Skin is warm and dry.  Neurological:     General: No focal deficit present.     Mental Status: He is alert and oriented to person, place, and time. Mental status is at baseline.  Psychiatric:        Mood and Affect: Mood normal.        Behavior: Behavior normal.        Thought Content: Thought content normal.      UC Treatments / Results  Labs (all labs ordered are listed, but only abnormal results are displayed) Labs Reviewed - No data to display  EKG   Radiology DG Ankle Complete Right Result Date: 04/16/2024 CLINICAL DATA:  Right ankle pain after injury.  Twisted ankle. EXAM: RIGHT ANKLE - COMPLETE 3+ VIEW COMPARISON:  None Available. FINDINGS: There is no evidence of fracture, dislocation, or joint effusion. Base of the fifth metatarsal is intact. The ankle mortise is preserved. There is no evidence of arthropathy or other focal bone abnormality. Mild soft tissue edema. IMPRESSION: Mild soft tissue edema. No fracture or dislocation. Electronically Signed   By: Andrea Gasman M.D.   On: 04/16/2024 19:37    Procedures Procedures (including critical care time)  Medications Ordered in UC Medications - No data to display  Initial Impression / Assessment and Plan / UC Course  I have reviewed the triage vital signs and the nursing notes.  Pertinent labs & imaging results that were available during my care of the patient were reviewed by me and considered in my medical decision making (see chart for details).     MDM: 1.  Acute right ankle pain-right ankle x-ray results revealed above, patient/mother advised;  2.  Injury of right ankle, initial encounter-Ace wrap placed on right ankle prior to discharge  today.  Advised patient/mother of right ankle x-ray results with hardcopy provided.  Advised may RICE affected area of right ankle for 30 minutes 3 times daily for the next 3 days.  Advised may take OTC ibuprofen  800 mg daily or as needed for right ankle pain.  Advised if symptoms worsen and/or unresolved please follow-up with your PCP or [facility] orthopedics for further evaluation. Final Clinical Impressions(s) / UC Diagnoses   Final diagnoses:  Acute right ankle pain  Injury of right ankle, initial encounter     Discharge Instructions      Advised patient/mother of right ankle x-ray results with hardcopy provided.  Advised may RICE affected area of right ankle for 30 minutes 3 times daily for the next 3 days.  Advised may take OTC ibuprofen  800 mg daily or as needed for right ankle pain.  Advised if symptoms worsen and/or unresolved please follow-up with your PCP or [facility] orthopedics for further evaluation.     ED Prescriptions   None    PDMP not reviewed this encounter.   Teddy Sharper, FNP 04/16/24 1950

## 2024-04-16 NOTE — ED Triage Notes (Signed)
 Patient states that he was running today and twisted his right ankle today.  Patient has not taken anything for pain.

## 2024-04-16 NOTE — Discharge Instructions (Addendum)
 Advised patient/mother of right ankle x-ray results with hardcopy provided.  Advised may RICE affected area of right ankle for 30 minutes 3 times daily for the next 3 days.  Advised may take OTC ibuprofen  800 mg daily or as needed for right ankle pain.  Advised if symptoms worsen and/or unresolved please follow-up with your PCP or [facility] orthopedics for further evaluation.

## 2024-06-19 ENCOUNTER — Other Ambulatory Visit: Payer: Self-pay

## 2024-06-19 ENCOUNTER — Ambulatory Visit
Admission: EM | Admit: 2024-06-19 | Discharge: 2024-06-19 | Disposition: A | Discharge status: Home / Self Care | Attending: Family Medicine | Admitting: Family Medicine

## 2024-06-19 DIAGNOSIS — U071 COVID-19: Secondary | ICD-10-CM

## 2024-06-19 DIAGNOSIS — M25571 Pain in right ankle and joints of right foot: Secondary | ICD-10-CM

## 2024-06-19 LAB — POC SOFIA SARS ANTIGEN FIA: SARS Coronavirus 2 Ag: POSITIVE — AB

## 2024-06-19 NOTE — Discharge Instructions (Addendum)
 For the COVID: Rest, fluids, Tylenol or ibuprofen   For the ankle: See sports medicine if it continues painful  May return to work later this week if you do not have a fever

## 2024-06-19 NOTE — ED Provider Notes (Signed)
 Ryan Mcdaniel    CSN: [id number] Arrival date & time: 06/19/24  1018      History   Chief Complaint Chief Complaint  Patient presents with   Cough   Sore Throat    HPI Ryan Mcdaniel is a 23 y.o. male.   Patient is here with 2 complaints.  First he traveled to [place] for his cousin's wedding and came back a couple days ago.  He has developed sore throat cough fever chills and bodyaches. Second he had a right ankle sprain some months ago.  He still has pain in his right ankle at times and it looks swollen.  He states with range of motion he will sometimes feel a click.  He is able to complete his full activities including athletics    Past Medical History:  Diagnosis Date   ADHD (attention deficit hyperactivity disorder)    ADHD (attention deficit hyperactivity disorder), combined type 12/30/2015   Chiari I malformation (HCC)    Dysgraphia 12/30/2015   Primary vitiligo 12/30/2015   Vitiligo     Patient Active Problem List   Diagnosis Date Noted   Central auditory processing disorder 04/11/2020   ADHD (attention deficit hyperactivity disorder), combined type 12/30/2015   Dysgraphia 12/30/2015   Primary vitiligo 12/30/2015    Past Surgical History:  Procedure Laterality Date   chiari malformaiton     age 11, preventative   CRANIOTOMY     HYPOSPADIAS CORRECTION     age 56 months       Home Medications    Prior to Admission medications   Medication Sig Start Date End Date Taking? Authorizing Provider  Dexmethylphenidate  HCl 30 MG CP24 Take 1 capsule (30 mg total) by mouth every morning. 12/06/22   Waddell Corean HERO, MD    Family History Family History  Adopted: Yes  Problem Relation Age of Onset   Sudden death Neg Hx    Hypertension Neg Hx    Heart attack Neg Hx    Hyperlipidemia Neg Hx    Diabetes Neg Hx     Social History Social History   Tobacco Use   Smoking status: Never    Passive exposure: Never   Smokeless tobacco: Never   Vaping Use   Vaping status: Never Used  Substance Use Topics   Alcohol use: Not Currently    Alcohol/week: 2.0 standard drinks of alcohol    Types: 2 Glasses of wine per week   Drug use: No     Allergies   Patient has no known allergies.   Review of Systems Review of Systems See HPI  Physical Exam Triage Vital Signs ED Triage Vitals  Encounter Vitals Group     BP 06/19/24 1026 130/79     Girls Systolic BP Percentile --      Girls Diastolic BP Percentile --      Boys Systolic BP Percentile --      Boys Diastolic BP Percentile --      Pulse Rate 06/19/24 1026 61     Resp 06/19/24 1026 16     Temp 06/19/24 1026 98.3 F (36.8 C)     Temp src --      SpO2 06/19/24 1026 98 %     Weight --      Height --      Head Circumference --      Peak Flow --      Pain Score 06/19/24 1029 5     Pain Loc --  Pain Education --      Exclude from Growth Chart --    No data found.  Updated Vital Signs BP 130/79   Pulse 61   Temp 98.3 F (36.8 C)   Resp 16   SpO2 98%       Physical Exam Constitutional:      General: He is not in acute distress.    Appearance: He is well-developed and normal weight.  HENT:     Head: Normocephalic and atraumatic.     Right Ear: Tympanic membrane normal.     Left Ear: Tympanic membrane normal.  Eyes:     Conjunctiva/sclera: Conjunctivae normal.     Pupils: Pupils are equal, round, and reactive to light.  Cardiovascular:     Rate and Rhythm: Normal rate and regular rhythm.     Heart sounds: Normal heart sounds.  Pulmonary:     Effort: Pulmonary effort is normal. No respiratory distress.  Abdominal:     General: There is no distension.     Palpations: Abdomen is soft.  Musculoskeletal:        General: No swelling, tenderness, deformity or signs of injury. Normal range of motion.     Cervical back: Normal range of motion.     Comments: Right ankle has full range of motion, no tenderness, no instability  Skin:    General: Skin is  warm and dry.  Neurological:     Mental Status: He is alert.      UC Treatments / Results  Labs (all labs ordered are listed, but only abnormal results are displayed) Labs Reviewed  POC SOFIA SARS ANTIGEN FIA - Abnormal; Notable for the following components:      Result Value   SARS Coronavirus 2 Ag Positive (*)    All other components within normal limits    EKG   Radiology No results found.  Procedures Procedures (including critical Mcdaniel time)  Medications Ordered in UC Medications - No data to display  Initial Impression / Assessment and Plan / UC Course  I have reviewed the triage vital signs and the nursing notes.  Pertinent labs & imaging results that were available during my Mcdaniel of the patient were reviewed by me and considered in my medical decision making (see chart for details).     Final Clinical Impressions(s) / UC Diagnoses   Final diagnoses:  COVID-19  Right ankle pain, unspecified chronicity     Discharge Instructions      For the COVID: Rest, fluids, Tylenol or ibuprofen   For the ankle: See sports medicine if it continues painful  May return to work later this week if you do not have a fever   ED Prescriptions   None    PDMP not reviewed this encounter.   Maranda Jamee Jacob, MD 06/19/24 [phone]

## 2024-06-19 NOTE — ED Triage Notes (Signed)
 C/o cough, congestion, sore throat, fatigue since Sunday. No fever. Has had cough/cold medicine.
